# Patient Record
Sex: Female | Born: 1975 | Race: White | Hispanic: No | Smoking: Never smoker
Health system: Southern US, Community
[De-identification: ages and names within clinical notes are randomized; demographics above are authoritative.]

## PROBLEM LIST (undated history)

## (undated) HISTORY — PX: OTHER SURGICAL HISTORY: SHX169

---

## 2008-04-02 ENCOUNTER — Ambulatory Visit: Payer: Self-pay | Admitting: Obstetrics and Gynecology

## 2008-04-08 ENCOUNTER — Ambulatory Visit: Payer: Self-pay | Admitting: Obstetrics and Gynecology

## 2009-03-07 ENCOUNTER — Ambulatory Visit: Payer: Self-pay | Admitting: Obstetrics and Gynecology

## 2014-05-31 ENCOUNTER — Ambulatory Visit: Payer: Self-pay | Admitting: Obstetrics and Gynecology

## 2015-01-30 ENCOUNTER — Encounter: Payer: Self-pay | Admitting: Emergency Medicine

## 2015-01-30 ENCOUNTER — Emergency Department
Admission: EM | Admit: 2015-01-30 | Discharge: 2015-01-31 | Disposition: A | Discharge status: Home / Self Care | Payer: BC Managed Care – PPO | Attending: Student | Admitting: Student

## 2015-01-30 ENCOUNTER — Other Ambulatory Visit: Payer: Self-pay

## 2015-01-30 DIAGNOSIS — T391X2A Poisoning by 4-Aminophenol derivatives, intentional self-harm, initial encounter: Secondary | ICD-10-CM | POA: Diagnosis not present

## 2015-01-30 DIAGNOSIS — R4689 Other symptoms and signs involving appearance and behavior: Secondary | ICD-10-CM

## 2015-01-30 DIAGNOSIS — R45851 Suicidal ideations: Secondary | ICD-10-CM

## 2015-01-30 DIAGNOSIS — T402X2A Poisoning by other opioids, intentional self-harm, initial encounter: Secondary | ICD-10-CM | POA: Diagnosis present

## 2015-01-30 DIAGNOSIS — R4589 Other symptoms and signs involving emotional state: Secondary | ICD-10-CM

## 2015-01-30 DIAGNOSIS — Z3202 Encounter for pregnancy test, result negative: Secondary | ICD-10-CM | POA: Diagnosis not present

## 2015-01-30 DIAGNOSIS — T40601A Poisoning by unspecified narcotics, accidental (unintentional), initial encounter: Secondary | ICD-10-CM

## 2015-01-30 LAB — CBC
HCT: 42.5 % (ref 35.0–47.0)
Hemoglobin: 14 g/dL (ref 12.0–16.0)
MCH: 29.8 pg (ref 26.0–34.0)
MCHC: 33 g/dL (ref 32.0–36.0)
MCV: 90.3 fL (ref 80.0–100.0)
Platelets: 147 10*3/uL — ABNORMAL LOW (ref 150–440)
RBC: 4.7 MIL/uL (ref 3.80–5.20)
RDW: 13.2 % (ref 11.5–14.5)
WBC: 6.7 10*3/uL (ref 3.6–11.0)

## 2015-01-30 LAB — COMPREHENSIVE METABOLIC PANEL
ALK PHOS: 47 U/L (ref 38–126)
ALT: 14 U/L (ref 14–54)
ANION GAP: 10 (ref 5–15)
AST: 23 U/L (ref 15–41)
Albumin: 4.5 g/dL (ref 3.5–5.0)
BILIRUBIN TOTAL: 0.8 mg/dL (ref 0.3–1.2)
BUN: 23 mg/dL — ABNORMAL HIGH (ref 6–20)
CHLORIDE: 102 mmol/L (ref 101–111)
CO2: 26 mmol/L (ref 22–32)
Calcium: 8.7 mg/dL — ABNORMAL LOW (ref 8.9–10.3)
Creatinine, Ser: 0.83 mg/dL (ref 0.44–1.00)
GFR calc Af Amer: >60 mL/min (ref >60)
GLUCOSE: 124 mg/dL — AB (ref 65–99)
POTASSIUM: 3.4 mmol/L — AB (ref 3.5–5.1)
SODIUM: 138 mmol/L (ref 135–145)
Total Protein: 7.6 g/dL (ref 6.5–8.1)

## 2015-01-30 LAB — HEPATIC FUNCTION PANEL
ALBUMIN: 4 g/dL (ref 3.5–5.0)
ALT: 15 U/L (ref 14–54)
AST: 21 U/L (ref 15–41)
Alkaline Phosphatase: 41 U/L (ref 38–126)
BILIRUBIN TOTAL: 0.6 mg/dL (ref 0.3–1.2)
Bilirubin, Direct: <0.1 mg/dL — ABNORMAL LOW (ref 0.1–0.5)
Indirect Bilirubin: UNDETERMINED mg/dL (ref 0.3–0.9)
TOTAL PROTEIN: 7.1 g/dL (ref 6.5–8.1)

## 2015-01-30 LAB — ACETAMINOPHEN LEVEL
Acetaminophen (Tylenol), Serum: 55 ug/mL — ABNORMAL HIGH (ref 10–30)
Acetaminophen (Tylenol), Serum: <10 ug/mL — ABNORMAL LOW (ref 10–30)

## 2015-01-30 LAB — URINE DRUG SCREEN, QUALITATIVE (ARMC ONLY)
Amphetamines, Ur Screen: NOT DETECTED
BARBITURATES, UR SCREEN: NOT DETECTED
Benzodiazepine, Ur Scrn: NOT DETECTED
CANNABINOID 50 NG, UR ~~LOC~~: NOT DETECTED
Cocaine Metabolite,Ur ~~LOC~~: NOT DETECTED
MDMA (ECSTASY) UR SCREEN: NOT DETECTED
METHADONE SCREEN, URINE: NOT DETECTED
Opiate, Ur Screen: POSITIVE — AB
PHENCYCLIDINE (PCP) UR S: NOT DETECTED
Tricyclic, Ur Screen: POSITIVE — AB

## 2015-01-30 LAB — ETHANOL: Alcohol, Ethyl (B): <5 mg/dL (ref <5)

## 2015-01-30 LAB — POCT PREGNANCY, URINE: Preg Test, Ur: NEGATIVE

## 2015-01-30 LAB — SALICYLATE LEVEL

## 2015-01-30 MED ORDER — ACETYLCYSTEINE 20 % IN SOLN
140.0000 mg/kg | Freq: Once | RESPIRATORY_TRACT | Status: AC
  Administered 2015-01-30: 8000 mg via ORAL
  Filled 2015-01-30: qty 40

## 2015-01-30 MED ORDER — ACETYLCYSTEINE 20 % IN SOLN
70.0000 mg/kg | RESPIRATORY_TRACT | Status: DC
  Filled 2015-01-30 (×5): qty 20

## 2015-01-30 NOTE — ED Notes (Signed)
BEHAVIORAL HEALTH ROUNDING Patient sleeping: No. Patient alert and oriented: yes Behavior appropriate: Yes.  ; If no, describe:  Nutrition and fluids offered: Yes  Toileting and hygiene offered: Yes  Sitter present: yes Law enforcement present: Yes  

## 2015-01-30 NOTE — ED Notes (Signed)

## 2015-01-30 NOTE — ED Notes (Signed)
Report given to poison control who is closing their chart.

## 2015-01-30 NOTE — ED Notes (Signed)
Poison control called for an update on patient. States they will call back for an update in 2 hours.

## 2015-01-30 NOTE — ED Notes (Signed)
Patient's brother is visiting. Patient initally was tearful, but is conversing easily with her brother at this time.

## 2015-01-30 NOTE — ED Notes (Signed)
IVC consult ordered

## 2015-01-30 NOTE — ED Provider Notes (Signed)
[facility] Emergency Department Provider Note  ____________________________________________  Time seen: Approximately 11:42 AM  I have reviewed the triage vital signs and the nursing notes.   HISTORY  Chief Complaint Drug Overdose    HPI Kristine Dixon is a 39 y.o. female with no chronic medical problems who presents under IVC for suicidal ideation and intentional overdose. Patient reports that she took "a handful" of pain pills last night around 9 pm. She reports they had codeine in them but does not know what else was in them and the bottles which were confiscated by police are noted to be found. She had some vomiting this morning. She reports that she was trying to kill herself. She has been depressed with suicidal thoughts for the past 2-3 weeks. Symptoms of been constant and worsening. Current severity 10 out of 10. She denies any recent illness including no cough, sneezing, runny nose, congestion. No modifying factors. No homicidal ideation or audiovisual hallucinations.   History reviewed. No pertinent past medical history.  There are no active problems to display for this patient.   History reviewed. No pertinent past surgical history.  No current outpatient prescriptions on file.  Allergies Review of patient's allergies indicates no known allergies.  History reviewed. No pertinent family history.  Social History History  Substance Use Topics  . Smoking status: Never Smoker   . Smokeless tobacco: Not on file  . Alcohol Use: No    Review of Systems Constitutional: No fever/chills Eyes: No visual changes. ENT: No sore throat. Cardiovascular: Denies chest pain. Respiratory: Denies shortness of breath. Gastrointestinal: No abdominal pain.  + nausea, + vomiting.  No diarrhea.  No constipation. Genitourinary: Negative for dysuria. Musculoskeletal: Negative for back pain. Skin: Negative for rash. Neurological: Negative for headaches, focal  weakness or numbness.  10-point ROS otherwise negative.  ____________________________________________   PHYSICAL EXAM:  VITAL SIGNS: ED Triage Vitals  Enc Vitals Group     BP 01/30/15 0948 132/102 mmHg     Pulse Rate 01/30/15 0948 78     Resp --      Temp 01/30/15 0949 97.6 F (36.4 C)     Temp Source 01/30/15 0949 Oral     SpO2 01/30/15 0948 100 %     Weight 01/30/15 0948 126 lb (57.153 kg)     Height 01/30/15 0948 5\' 5"  (1.651 m)     Head Cir --      Peak Flow --      Pain Score --      Pain Loc --      Pain Edu? --      Excl. in GC? --     Constitutional: Alert and oriented. Well appearing and in no acute distress. Eyes: Conjunctivae are normal. PERRL. EOMI. Head: Atraumatic. Nose: No congestion/rhinnorhea. Mouth/Throat: Mucous membranes are moist.  Oropharynx non-erythematous. Neck: No stridor.  Cardiovascular: Normal rate, regular rhythm. Grossly normal heart sounds.  Good peripheral circulation. Respiratory: Normal respiratory effort.  No retractions. Lungs CTAB. Gastrointestinal: Soft and nontender. No distention. No abdominal bruits. No CVA tenderness. Genitourinary: deferred Musculoskeletal: No lower extremity tenderness nor edema.  No joint effusions. Neurologic:  Normal speech and language. No gross focal neurologic deficits are appreciated. Speech is normal. No gait instability. Skin:  Skin is warm, dry and intact. No rash noted. Psychiatric: Mood and affect are normal. Speech and behavior are normal.  ____________________________________________   LABS (all labs ordered are listed, but only abnormal results are displayed)  Labs Reviewed  ACETAMINOPHEN LEVEL - Abnormal; Notable for the following:    Acetaminophen (Tylenol), Serum 55 (*)    All other components within normal limits  CBC - Abnormal; Notable for the following:    Platelets 147 (*)    All other components within normal limits  COMPREHENSIVE METABOLIC PANEL - Abnormal; Notable for the  following:    Potassium 3.4 (*)    Glucose, Bld 124 (*)    BUN 23 (*)    Calcium 8.7 (*)    All other components within normal limits  URINE DRUG SCREEN, QUALITATIVE ([facility]) - Abnormal; Notable for the following:    Tricyclic, Ur Screen POSITIVE (*)    Opiate, Ur Screen POSITIVE (*)    All other components within normal limits  ETHANOL  SALICYLATE LEVEL  POC URINE PREG, ED  POCT PREGNANCY, URINE   ____________________________________________  EKG  ED ECG REPORT   Date: 01/30/2015  EKG Time: 14:16   Rate: 82  Rhythm:normal sinus rhythm  Axis: Normal  Intervals:none  ST&T Change: Nonspecific T-wave inversion in lead III, V2, V3  ____________________________________________  RADIOLOGY  none ____________________________________________   PROCEDURES  Procedure(s) performed: None  Critical Care performed: Yes, see critical care note(s). Total critical care time spent is 45 minutes.  ____________________________________________   INITIAL IMPRESSION / ASSESSMENT AND PLAN / ED COURSE  Pertinent labs & imaging results that were available during my care of the patient were reviewed by me and considered in my medical decision making (see chart for details).  Kristine Dixon is a 39 y.o. female with no chronic medical problems who presents under IVC for suicidal ideation and intentional overdose. Currently vital signs stable, in no acute distress and she has no acute medical complaints. Plan for psychiatric labs, behavioral health consult, continue IVC, psychiatry consult.  ----------------------------------------- 2:24 PM on 01/30/2015 -----------------------------------------  The case was discussed with [facility]: [facility]. Concern for critical Tylenol overdose given acetaminophen level elevated at 55 approximatelty 11 hours after ingestion therefore will follow recommendations to start PO Mucomyst therapy. 140 mg/kg loading dose given, to be followed by every 4 hours  maintenance dose x5 doses after which we will  repeat Tylenol level and LFTs. Patient had normal LFTs on arrival.  ----------------------------------------- 3:15 PM on 01/30/2015 -----------------------------------------  Patient tolerating NAC. Transfer to Dr. Darnelle CatalanMalinda.  ____________________________________________   FINAL CLINICAL IMPRESSION(S) / ED DIAGNOSES  Final diagnoses:  Intentional codeine overdose, initial encounter  Suicidal ideation  Overdose on Tylenol, intentional self-harm, initial encounter      Gayla DossEryka A Delaine Hernandez, MD 01/30/15 1517

## 2015-01-30 NOTE — ED Notes (Signed)
ED BHU PLACEMENT JUSTIFICATION Is the patient under IVC or is there intent for IVC: Yes.   Is the patient medically cleared: Yes.   Is there vacancy in the ED BHU: No. Is the population mix appropriate for patient: Yes.   Is the patient awaiting placement in inpatient or outpatient setting: Yes.   Has the patient had a psychiatric consult: Yes.   Survey of unit performed for contraband, proper placement and condition of furniture, tampering with fixtures in bathroom, shower, and each patient room: Yes.  ; Findings:  APPEARANCE/BEHAVIOR calm, cooperative and adequate rapport can be established NEURO ASSESSMENT Orientation: time, place and person Hallucinations: No.None noted (Hallucinations) Speech: Normal Gait: normal RESPIRATORY ASSESSMENT Normal expansion.  Clear to auscultation.  No rales, rhonchi, or wheezing. CARDIOVASCULAR ASSESSMENT regular rate and rhythm, S1, S2 normal, no murmur, click, rub or gallop GASTROINTESTINAL ASSESSMENT soft, nontender, BS WNL, no r/g EXTREMITIES normal strength, tone, and muscle mass PLAN OF CARE Provide calm/safe environment. Vital signs assessed twice daily. ED BHU Assessment once each 12-hour shift. Collaborate with intake RN daily or as condition indicates. Assure the ED provider has rounded once each shift. Provide and encourage hygiene. Provide redirection as needed. Assess for escalating behavior; address immediately and inform ED provider.  Assess family dynamic and appropriateness for visitation as needed: Yes.  ; If necessary, describe findings:  Educate the patient/family about BHU procedures/visitation: Yes.  ; If necessary, describe findings:

## 2015-01-30 NOTE — ED Notes (Signed)
Brought in  [facility]ia [facility] under IVC for taking a poss overdose

## 2015-01-30 NOTE — ED Notes (Signed)
Patient refused ED behavioral meal. Patient states she eats healthy and won't eat hot dogs and french fries. Patient was given an ED tray with a [place] sandwich.
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
## 2015-01-30 NOTE — ED Notes (Addendum)
BEHAVIORAL HEALTH ROUNDING Patient sleeping: No. Patient alert and oriented: yes Behavior appropriate: Yes.  ; If no, describe:  Nutrition and fluids offered: Yes  Toileting and hygiene offered: Yes  Sitter present: yes Law enforcement present: Yes  

## 2015-01-30 NOTE — ED Notes (Signed)
BEHAVIORAL HEALTH ROUNDING Patient sleeping: Yes.   Patient alert and oriented: not applicable Behavior appropriate: Yes.  ; If no, describe:  Nutrition and fluids offered: No. Pt. Is sleeping. Snack given earlier. Toileting and hygiene offered: No Sitter present: yes Law enforcement present: Yes

## 2015-01-30 NOTE — ED Notes (Signed)
Pt brought in on IVC/No consult ordered at this time

## 2015-01-30 NOTE — Consult Note (Signed)
[facility] Face-to-Face Psychiatry Consult   Reason for Consult:  39 year old woman brought into the hospital after taking an overdose of prescription narcotics. Consult for suicidality Referring Physician:  gayle Patient Identification: LAURIEL HELIN MRN:  [id number] Principal Diagnosis: Recurrent major depression-severe Diagnosis:   Patient Active Problem List   Diagnosis Date Noted  . Recurrent major depression-severe [F33.2] 01/30/2015  . Suicidal behavior [F48.9] 01/30/2015  . Opiate overdose [T40.601A] 01/30/2015    Total Time spent with patient: 1 hour  Subjective:   Kristine Dixon is a 39 y.o. female patient admitted with "I took some pills". Patient admits to taking an overdose of narcotic pain medicine being depressed having suicidal ideation.  HPI:  Patient and chart reviewed. Patient interviewed. Patient says that she took her daughter's pain medicine yesterday. Admits that she took a handful of medicine not prescribed for her. She said she did this because she's been depressed and stressed and was hoping that she would die. She denied feeling depressed for months it's been getting worse for the last couple weeks. Her sleep is very poor at night. Her appetite is poor and she is losing weight. She is not actively getting any psychiatric treatment. Her concentration has been worse. Frequent crying spells. Major stress is that her husband is apparently leaving her for another woman. Overwhelmed. Also describes difficulty taking care of her 43 year old son who still lives at home. Work is also stressful although she still been able to attend it. Denies that she's been drinking denies that she routinely abuses any drugs.  Past psychiatric history: There is a previous episode of severe depression and also revolving around difficulties with her husband about 11 years ago. She thinks she may have had a suicide attempt at that time and was hospitalized briefly. Remember what medicine she took  time.  Social history is that she works at [employer]. She has 2 children ages 61 and 78. Husband is in the process of moving out of the house and leaving her.  Medical history is negative for any significant ongoing medical problems. Patient is not currently on any prescription medicine.  Family history is positive for suicide in one aunt.  Current medications none  Substance abuse history negative for alcohol or drug abuse HPI Elements:   Quality:  Depressed mood and anxiety hopelessness. Severity:  Severe and life-threatening. Timing:  Worse over the past couple months chronic and recurrent. Duration:  Suicidality bad yesterday depression worse for the last few weeks. Context:  Patient is continuing to feel very depressed. Social problems. Marriage breaking up.  Past Medical History: History reviewed. No pertinent past medical history. History reviewed. No pertinent past surgical history. Family History: History reviewed. No pertinent family history. Social History:  History  Alcohol Use No     History  Drug Use Not on file    History   Social History  . Marital Status: Married    Spouse Name: N/A  . Number of Children: N/A  . Years of Education: N/A   Social History Main Topics  . Smoking status: Never Smoker   . Smokeless tobacco: Not on file  . Alcohol Use: No  . Drug Use: Not on file  . Sexual Activity: Not on file   Other Topics Concern  . None   Social History Narrative  . None   Additional Social History:  Allergies:  Allergies no known allergies  Labs:  Results for orders placed or performed during the hospital encounter of 01/30/15 (from the past 48 hour(s))  Urine Drug Screen, [facility])     Status: Abnormal   Collection Time: 01/30/15  9:54 AM  Result Value Ref Range   Tricyclic, Ur Screen POSITIVE (A) NONE DETECTED   Amphetamines, Ur Screen NONE DETECTED NONE DETECTED   MDMA (Ecstasy)Ur Screen NONE  DETECTED NONE DETECTED   Cocaine Metabolite,Ur [place] NONE DETECTED NONE DETECTED   Opiate, Ur Screen POSITIVE (A) NONE DETECTED   Phencyclidine (PCP) Ur S NONE DETECTED NONE DETECTED   Cannabinoid 50 Ng, Ur [place] NONE DETECTED NONE DETECTED   Barbiturates, Ur Screen NONE DETECTED NONE DETECTED   Benzodiazepine, Ur Scrn NONE DETECTED NONE DETECTED   Methadone Scn, Ur NONE DETECTED NONE DETECTED    Comment: (NOTE) 510  Tricyclics, urine               Cutoff 1000 ng/mL 200  Amphetamines, urine             Cutoff 1000 ng/mL 300  MDMA (Ecstasy), urine           Cutoff 500 ng/mL 400  Cocaine Metabolite, urine       Cutoff 300 ng/mL 500  Opiate, urine                   Cutoff 300 ng/mL 600  Phencyclidine (PCP), urine      Cutoff 25 ng/mL 700  Cannabinoid, urine              Cutoff 50 ng/mL 800  Barbiturates, urine             Cutoff 200 ng/mL 900  Benzodiazepine, urine           Cutoff 200 ng/mL 1000 Methadone, urine                Cutoff 300 ng/mL 1100 1200 The urine drug screen provides only a preliminary, unconfirmed 1300 analytical test result and should not be used for non-medical 1400 purposes. Clinical consideration and professional judgment should 1500 be applied to any positive drug screen result due to possible 1600 interfering substances. A more specific alternate chemical method 1700 must be used in order to obtain a confirmed analytical result.  1800 Gas chromato graphy / mass spectrometry (GC/MS) is the preferred 1900 confirmatory method.   Acetaminophen level     Status: Abnormal   Collection Time: 01/30/15  9:56 AM  Result Value Ref Range   Acetaminophen (Tylenol), Serum 55 (H) 10 - 30 ug/mL    Comment:        THERAPEUTIC CONCENTRATIONS VARY SIGNIFICANTLY. A RANGE OF 10-30 ug/mL MAY BE AN EFFECTIVE CONCENTRATION FOR MANY PATIENTS. HOWEVER, SOME ARE BEST TREATED AT CONCENTRATIONS OUTSIDE THIS RANGE. ACETAMINOPHEN CONCENTRATIONS >150 ug/mL AT 4 HOURS AFTER INGESTION AND  >50 ug/mL AT 12 HOURS AFTER INGESTION ARE OFTEN ASSOCIATED WITH TOXIC REACTIONS.   CBC     Status: Abnormal   Collection Time: 01/30/15  9:56 AM  Result Value Ref Range   WBC 6.7 3.6 - 11.0 K/uL   RBC 4.70 3.80 - 5.20 MIL/uL   Hemoglobin 14.0 12.0 - 16.0 g/dL   HCT 42.5 35.0 - 47.0 %   MCV 90.3 80.0 - 100.0 fL   MCH 29.8 26.0 - 34.0 pg   MCHC 33.0 32.0 - 36.0 g/dL   RDW 13.2 11.5 - 14.5 %   Platelets  147 (L) 150 - 440 K/uL  Comprehensive metabolic panel     Status: Abnormal   Collection Time: 01/30/15  9:56 AM  Result Value Ref Range   Sodium 138 135 - 145 mmol/L   Potassium 3.4 (L) 3.5 - 5.1 mmol/L   Chloride 102 101 - 111 mmol/L   CO2 26 22 - 32 mmol/L   Glucose, Bld 124 (H) 65 - 99 mg/dL   BUN 23 (H) 6 - 20 mg/dL   Creatinine, Ser 0.83 0.44 - 1.00 mg/dL   Calcium 8.7 (L) 8.9 - 10.3 mg/dL   Total Protein 7.6 6.5 - 8.1 g/dL   Albumin 4.5 3.5 - 5.0 g/dL   AST 23 15 - 41 U/L   ALT 14 14 - 54 U/L   Alkaline Phosphatase 47 38 - 126 U/L   Total Bilirubin 0.8 0.3 - 1.2 mg/dL   GFR calc non Af Amer >60 >60 mL/min   GFR calc Af Amer >60 >60 mL/min    Comment: (NOTE) The eGFR has been calculated using the CKD EPI equation. This calculation has not been validated in all clinical situations. eGFR's persistently <60 mL/min signify possible Chronic Kidney Disease.    Anion gap 10 5 - 15  Ethanol (ETOH)     Status: None   Collection Time: 01/30/15  9:56 AM  Result Value Ref Range   Alcohol, Ethyl (B) <5 <5 mg/dL    Comment:        LOWEST DETECTABLE LIMIT FOR SERUM ALCOHOL IS 11 mg/dL FOR MEDICAL PURPOSES ONLY   Salicylate level     Status: None   Collection Time: 01/30/15  9:56 AM  Result Value Ref Range   Salicylate Lvl <1.6 2.8 - 30.0 mg/dL  Pregnancy, urine POC     Status: None   Collection Time: 01/30/15 10:05 AM  Result Value Ref Range   Preg Test, Ur NEGATIVE NEGATIVE    Comment:        THE SENSITIVITY OF THIS METHODOLOGY IS >24 mIU/mL     Vitals: Blood  pressure 132/102, pulse 78, temperature 97.6 F (36.4 C), temperature source Oral, height _0  (1.651 m), weight 57.153 kg (126 lb), SpO2 100 %.  Risk to Self: Is patient at risk for suicide?: Yes Risk to Others:   Prior Inpatient Therapy:   Prior Outpatient Therapy:    Current Facility-Administered Medications  Medication Dose Route Frequency Provider Last Rate Last Dose  . acetylcysteine (MUCOMYST) 20 % nebulizer / oral solution 4,000 mg  70 mg/kg Oral Q4H Joanne Gavel, MD       No current outpatient prescriptions on file.    Musculoskeletal: Strength & Muscle Tone: within normal limits Gait & Station: normal Patient leans: N/A  Psychiatric Specialty Exam: Physical Exam  Constitutional: She is oriented to person, place, and time. She appears well-developed and well-nourished.  HENT:  Head: Normocephalic.  Eyes: Pupils are equal, round, and reactive to light.  Neck: Normal range of motion. Neck supple.  Respiratory: Effort normal.  Musculoskeletal: Normal range of motion.  Neurological: She is alert and oriented to person, place, and time.  Skin: Skin is warm and dry.  Psychiatric: Her mood appears anxious. Her affect is blunt. Her speech is delayed. She is withdrawn. She expresses impulsivity. She exhibits a depressed mood. She expresses suicidal ideation. She exhibits abnormal recent memory.    Review of Systems  Constitutional: Positive for malaise/fatigue.  HENT: Negative.   Eyes: Negative.   Respiratory: Negative.  Cardiovascular: Negative.   Gastrointestinal: Negative.   Skin: Negative.   Neurological: Negative.   Psychiatric/Behavioral: Positive for depression, suicidal ideas and substance abuse. Negative for hallucinations and memory loss. The patient is nervous/anxious and has insomnia.     Blood pressure 132/102, pulse 78, temperature 97.6 F (36.4 C), temperature source Oral, height _0  (1.651 m), weight 57.153 kg (126 lb), SpO2 100 %.Body mass index is  20.97 kg/(m^2).  General Appearance: Negative and Disheveled  Eye Contact::  Minimal  Speech:  Slow  Volume:  Decreased  Mood:  Depressed  Affect:  Flat  Thought Process:  Circumstantial and Tangential  Orientation:  Full (Time, Place, and Person)  Thought Content:  Negative  Suicidal Thoughts:  Yes.  with intent/plan  Homicidal Thoughts:  No  Memory:  Immediate;   Fair Recent;   Fair Remote;   Fair  Judgement:  Impaired  Insight:  Shallow  Psychomotor Activity:  Decreased  Concentration:  Fair  Recall:  [employer] of Knowledge:Fair  Language: Good  Akathisia:  No  Handed:  Right  AIMS (if indicated):     Assets:  Communication Skills Housing Talents/Skills  ADL's:  Intact  Cognition: WNL  Sleep:      Medical Decision Making: Review of Psycho-Social Stressors (1), Established Problem, Worsening (2), New Problem, with no additional work-up planned (3), Review of Last Therapy Session (1), Review or order medicine tests (1) and Review of Medication Regimen & Side Effects (2)  Treatment Plan Summary: Medication management and Plan Patient had a serious suicide attempt multiple symptoms of major depression. Requires inpatient hospitalization. Patient will be admitted to the psychiatric service. Primary treatment team can consider possible medication. Sleep and anxiety medicine when necessary for the time being. Continue involuntary commitment. Case discussed with emergency room physician and psychiatrist staff  Plan:  Recommend psychiatric Inpatient admission when medically cleared. Disposition: Recommend admission to psychiatry  Alethia Berthold 01/30/2015 5:06 PM

## 2015-01-30 NOTE — ED Notes (Signed)
Patient has been given something to drink throughout the day, but states she hasn't had anything to drink today. Patient is expressing some agitation with the rules regarding visitors and showers.

## 2015-01-31 ENCOUNTER — Inpatient Hospital Stay
Admission: EM | Admit: 2015-01-31 | Discharge: 2015-01-31 | DRG: 882 | Disposition: A | Discharge status: Home / Self Care | Payer: BC Managed Care – PPO | Source: Ambulatory Visit | Attending: Psychiatry | Admitting: Psychiatry

## 2015-01-31 DIAGNOSIS — F4323 Adjustment disorder with mixed anxiety and depressed mood: Secondary | ICD-10-CM | POA: Diagnosis present

## 2015-01-31 DIAGNOSIS — Z630 Problems in relationship with spouse or partner: Secondary | ICD-10-CM

## 2015-01-31 DIAGNOSIS — T40601A Poisoning by unspecified narcotics, accidental (unintentional), initial encounter: Secondary | ICD-10-CM | POA: Diagnosis present

## 2015-01-31 NOTE — Progress Notes (Signed)
Patient ID: Elesa HackerJane E Smithey, female   DOB: 06/20/1976, 39 y.o.   MRN: [id number]  Admission Note:  [age] yr female who presents IVC in no acute distress for the treatment of SI and Depression. Pt appears flat and depressed. Pt was calm and cooperative with admission process. Pt denies SI/HI/AVH at this time. Pt stated she was having problems with her marriage. Pt husband has been talking with an ex- girlfriend on his new [employer] account. Pt new to facebook 3 months. Pt focused on her situation and appears to be in denial about the things that have happened. Pt stated she has to get back to work Monday. On assessment pt admitted to taking handful of Tylenol, and realizing it was a mistake immediately after taking them. Pt appears to be focused on her husband  And the situation rather than getting herself together.    A:POC and unit policies explained and understanding verbalized. Consents obtained. Food and fluids offered, and refused .   R:Pt had no additional questions or concerns.

## 2015-01-31 NOTE — [facility] Suicide Risk Assessment (Signed)
[facility]   Demographic Factors:  NA  Total Time spent with patient: 1 hour  Musculoskeletal: Strength & Muscle Tone: within normal limits Gait & Station: normal Patient leans: N/A  Psychiatric Specialty Exam: Physical Exam  Nursing note and vitals reviewed.   Review of Systems  All other systems reviewed and are negative.   Blood pressure 107/66, pulse 97, temperature 98.6 F (37 C), temperature source Oral, resp. rate 18, height 5\' 5"  (1.651 m), weight 56.246 kg (124 lb), last menstrual period 01/01/2015.Body mass index is 20.63 kg/(m^2).  See SRA                                              Sleep:  Number of Hours: 6.15  Cognition: WNL  ADL's:  Intact   Have you used any form of tobacco in the last 30 days? (Cigarettes, Smokeless Tobacco, Cigars, and/or Pipes): No  Has this patient used any form of tobacco in the last 30 days? (Cigarettes, Smokeless Tobacco, Cigars, and/or Pipes) No  Mental Status Per Nursing Assessment::   On Admission:     Current Mental Status by Physician: NA  Loss Factors: Loss of significant relationship and NA  Historical Factors: NA  Risk Reduction Factors:   Responsible for children under 41 years of age, Sense of responsibility to family, Religious beliefs about death, Employed, Living with another person, especially a relative, Positive social support and Positive coping skills or problem solving skills  Continued Clinical Symptoms: None.   Cognitive Features That Contribute To Risk:  None    Suicide Risk:  Minimal: No identifiable suicidal ideation.  Patients presenting with no risk factors but with morbid ruminations; may be classified as minimal risk based on the severity of the depressive symptoms  Principal Problem: Adjustment disorder with mixed anxiety and depressed mood Discharge Diagnoses:  Patient Active Problem List   Diagnosis Date Noted  . Adjustment disorder with  mixed anxiety and depressed mood [F43.23] 01/31/2015  . Opiate overdose [T40.601A] 01/30/2015    Follow-up Information    Schedule an appointment as soon as possible for a visit with Therapist.   Why:  Hospital Follow up, Therapy   Contact information:   Please follow up with one of the providers on the list given to you.  If there is any trouble please call [phone] for assistance with an appointment.      Plan Of Care/Follow-up recommendations:  Activity:  as tolerated Diet:  regular Other:  follow up with therapy appointemnty.  Is patient on multiple antipsychotic therapies at discharge:  No   Has Patient had three or more failed trials of antipsychotic monotherapy by history:  No  Recommended Plan for Multiple Antipsychotic Therapies: NA    Kristine Dixon 01/31/2015, 1:34 PM

## 2015-01-31 NOTE — H&P (Signed)
Psychiatric Admission Assessment Adult  Patient Identification: Kristine Dixon MRN:  [id number] Date of Evaluation:  01/31/2015 Chief Complaint:  behav med Principal Diagnosis: Adjustment disorder with mixed anxiety and depressed mood Diagnosis:   Patient Active Problem List   Diagnosis Date Noted  . Adjustment disorder with mixed anxiety and depressed mood [F43.23] 01/31/2015  . Opiate overdose [T40.601A] 01/30/2015   History of Present Illness::   Identifying data. Kristine Dixon is a 39 year old female with no past psychiatric history.  Chief complaint. "I'm mad."  History of present illness. Kristine Dixon has no past psychiatric history. She was brought to the emergency room after she took some pain killers that were prescribed for her daughter. This is in the context of marital conflict and possibly divorce. She has been married for 30 some years. Recently her husband disclosed that he is having a long distance Facebook affair with his old flame. He wants a divorce. He has already contacted deciliter. This was unexpected even though the marriage has not been happy lately. The patient took PAINKILLERS that she found in her daughter's room to go to sleep but denies having suicidal intention. She went to sleep and woke up the next morning to realize that her daughter was aware of her suicide attempt. She noticed an empty bottle. She is upset because she thinks that her husband about the overdose and he did not respond. She ended up in the hospital after her daughter contacted her mother-in-law the morning following an overdose. As she denies any symptoms of depression at present. She denies excessive anxiety. She denies symptoms suggestive of bipolar mania. There are no psychotic symptoms. She is not suicidal or homicidal. She denies any use of alcohol prescription pills or illicit substances.  Past psychiatric history. She has never been treated for depression, never hospitalized, no suicide  attempts.  Total Time spent with patient: 1 hour  Past Medical History: History reviewed. No pertinent past medical history.  Past Surgical History  Procedure Laterality Date  . Other surgical history      right ovary removed 2010   Family History: History reviewed. No pertinent family history. Social History:  History  Alcohol Use No     History  Drug Use Not on file    History   Social History  . Marital Status: Married    Spouse Name: N/A  . Number of Children: N/A  . Years of Education: N/A   Social History Main Topics  . Smoking status: Never Smoker   . Smokeless tobacco: Not on file  . Alcohol Use: No  . Drug Use: Not on file  . Sexual Activity: Yes    Birth Control/ Protection: None   Other Topics Concern  . None   Social History Narrative   Additional Social History:  She has known her husband since the age of 79. They've been married for a long time. They have 2 children while in college and one high school senior. 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                          Musculoskeletal: Strength & Muscle Tone: within normal limits Gait & Station: normal Patient leans: N/A  Psychiatric Specialty Exam: Physical Exam  Nursing note and vitals reviewed. Eyes: No scleral icterus.    Review of Systems  All other systems reviewed and are negative.   Blood pressure 107/66, pulse 97,  temperature 98.6 F (37 C), temperature source Oral, resp. rate 18, height '5\' 5"'  (1.651 m), weight 56.246 kg (124 lb), last menstrual period 01/01/2015.Body mass index is 20.63 kg/(m^2).  See SRA                                                  Sleep:  Number of Hours: 6.15   Risk to Self: Is patient at risk for suicide?: No Risk to Others:   Prior Inpatient Therapy:   Prior Outpatient Therapy:    Alcohol Screening: 1. How often do you have a drink containing alcohol?: Never 9. Have you  or someone else been injured as a result of your drinking?: No 10. Has a relative or friend or a doctor or another health worker been concerned about your drinking or suggested you cut down?: No Alcohol Use Disorder Identification Test Final Score (AUDIT): 0 Brief Intervention: AUDIT score less than 7 or less-screening does not suggest unhealthy drinking-brief intervention not indicated  Allergies:  No Known Allergies Lab Results:  Results for orders placed or performed during the hospital encounter of 01/30/15 (from the past 48 hour(s))  Urine Drug Screen, Qualitative [facility])     Status: Abnormal   Collection Time: 01/30/15  9:54 AM  Result Value Ref Range   Tricyclic, Ur Screen POSITIVE (A) NONE DETECTED   Amphetamines, Ur Screen NONE DETECTED NONE DETECTED   MDMA (Ecstasy)Ur Screen NONE DETECTED NONE DETECTED   Cocaine Metabolite,Ur [place] NONE DETECTED NONE DETECTED   Opiate, Ur Screen POSITIVE (A) NONE DETECTED   Phencyclidine (PCP) Ur S NONE DETECTED NONE DETECTED   Cannabinoid 50 Ng, Ur [place] NONE DETECTED NONE DETECTED   Barbiturates, Ur Screen NONE DETECTED NONE DETECTED   Benzodiazepine, Ur Scrn NONE DETECTED NONE DETECTED   Methadone Scn, Ur NONE DETECTED NONE DETECTED    Comment: (NOTE) 829  Tricyclics, urine               Cutoff 1000 ng/mL 200  Amphetamines, urine             Cutoff 1000 ng/mL 300  MDMA (Ecstasy), urine           Cutoff 500 ng/mL 400  Cocaine Metabolite, urine       Cutoff 300 ng/mL 500  Opiate, urine                   Cutoff 300 ng/mL 600  Phencyclidine (PCP), urine      Cutoff 25 ng/mL 700  Cannabinoid, urine              Cutoff 50 ng/mL 800  Barbiturates, urine             Cutoff 200 ng/mL 900  Benzodiazepine, urine           Cutoff 200 ng/mL 1000 Methadone, urine                Cutoff 300 ng/mL 1100 1200 The urine drug screen provides only a preliminary, unconfirmed 1300 analytical test result and should not be used for non-medical 1400 purposes. Clinical  consideration and professional judgment should 1500 be applied to any positive drug screen result due to possible 1600 interfering substances. A more specific alternate chemical method 1700 must be used in order to obtain a confirmed analytical result.  [place] /  mass spectrometry (GC/MS) is the preferred 1900 confirmatory method.   Acetaminophen level     Status: Abnormal   Collection Time: 01/30/15  9:56 AM  Result Value Ref Range   Acetaminophen (Tylenol), Serum 55 (H) 10 - 30 ug/mL    Comment:        THERAPEUTIC CONCENTRATIONS VARY SIGNIFICANTLY. A RANGE OF 10-30 ug/mL MAY BE AN EFFECTIVE CONCENTRATION FOR MANY PATIENTS. HOWEVER, SOME ARE BEST TREATED AT CONCENTRATIONS OUTSIDE THIS RANGE. ACETAMINOPHEN CONCENTRATIONS >150 ug/mL AT 4 HOURS AFTER INGESTION AND >50 ug/mL AT 12 HOURS AFTER INGESTION ARE OFTEN ASSOCIATED WITH TOXIC REACTIONS.   CBC     Status: Abnormal   Collection Time: 01/30/15  9:56 AM  Result Value Ref Range   WBC 6.7 3.6 - 11.0 K/uL   RBC 4.70 3.80 - 5.20 MIL/uL   Hemoglobin 14.0 12.0 - 16.0 g/dL   HCT 42.5 35.0 - 47.0 %   MCV 90.3 80.0 - 100.0 fL   MCH 29.8 26.0 - 34.0 pg   MCHC 33.0 32.0 - 36.0 g/dL   RDW 13.2 11.5 - 14.5 %   Platelets 147 (L) 150 - 440 K/uL  Comprehensive metabolic panel     Status: Abnormal   Collection Time: 01/30/15  9:56 AM  Result Value Ref Range   Sodium 138 135 - 145 mmol/L   Potassium 3.4 (L) 3.5 - 5.1 mmol/L   Chloride 102 101 - 111 mmol/L   CO2 26 22 - 32 mmol/L   Glucose, Bld 124 (H) 65 - 99 mg/dL   BUN 23 (H) 6 - 20 mg/dL   Creatinine, Ser 0.83 0.44 - 1.00 mg/dL   Calcium 8.7 (L) 8.9 - 10.3 mg/dL   Total Protein 7.6 6.5 - 8.1 g/dL   Albumin 4.5 3.5 - 5.0 g/dL   AST 23 15 - 41 U/L   ALT 14 14 - 54 U/L   Alkaline Phosphatase 47 38 - 126 U/L   Total Bilirubin 0.8 0.3 - 1.2 mg/dL   GFR calc non Af Amer >60 >60 mL/min   GFR calc Af Amer >60 >60 mL/min    Comment: (NOTE) The eGFR has been  calculated using the CKD EPI equation. This calculation has not been validated in all clinical situations. eGFR's persistently <60 mL/min signify possible Chronic Kidney Disease.    Anion gap 10 5 - 15  Ethanol (ETOH)     Status: None   Collection Time: 01/30/15  9:56 AM  Result Value Ref Range   Alcohol, Ethyl (B) <5 <5 mg/dL    Comment:        LOWEST DETECTABLE LIMIT FOR SERUM ALCOHOL IS 11 mg/dL FOR MEDICAL PURPOSES ONLY   Salicylate level     Status: None   Collection Time: 01/30/15  9:56 AM  Result Value Ref Range   Salicylate Lvl <3.2 2.8 - 30.0 mg/dL  Pregnancy, urine POC     Status: None   Collection Time: 01/30/15 10:05 AM  Result Value Ref Range   Preg Test, Ur NEGATIVE NEGATIVE    Comment:        THE SENSITIVITY OF THIS METHODOLOGY IS >24 mIU/mL   Hepatic function panel     Status: Abnormal   Collection Time: 01/30/15  5:52 PM  Result Value Ref Range   Total Protein 7.1 6.5 - 8.1 g/dL   Albumin 4.0 3.5 - 5.0 g/dL   AST 21 15 - 41 U/L   ALT 15 14 - 54 U/L   Alkaline  Phosphatase 41 38 - 126 U/L   Total Bilirubin 0.6 0.3 - 1.2 mg/dL   Bilirubin, Direct <0.1 (L) 0.1 - 0.5 mg/dL   Indirect Bilirubin UNABLE TO CALCULATE 0.3 - 0.9 mg/dL  Acetaminophen level     Status: Abnormal   Collection Time: 01/30/15  6:41 PM  Result Value Ref Range   Acetaminophen (Tylenol), Serum <10 (L) 10 - 30 ug/mL    Comment:        THERAPEUTIC CONCENTRATIONS VARY SIGNIFICANTLY. A RANGE OF 10-30 ug/mL MAY BE AN EFFECTIVE CONCENTRATION FOR MANY PATIENTS. HOWEVER, SOME ARE BEST TREATED AT CONCENTRATIONS OUTSIDE THIS RANGE. ACETAMINOPHEN CONCENTRATIONS >150 ug/mL AT 4 HOURS AFTER INGESTION AND >50 ug/mL AT 12 HOURS AFTER INGESTION ARE OFTEN ASSOCIATED WITH TOXIC REACTIONS.    Current Medications: No current facility-administered medications for this encounter.   PTA Medications: No prescriptions prior to admission    Previous Psychotropic Medications: No   Substance Abuse  History in the last 12 months:  No.    Consequences of Substance Abuse: NA  Results for orders placed or performed during the hospital encounter of 01/30/15 (from the past 72 hour(s))  Urine Drug Screen, Qualitative [facility])     Status: Abnormal   Collection Time: 01/30/15  9:54 AM  Result Value Ref Range   Tricyclic, Ur Screen POSITIVE (A) NONE DETECTED   Amphetamines, Ur Screen NONE DETECTED NONE DETECTED   MDMA (Ecstasy)Ur Screen NONE DETECTED NONE DETECTED   Cocaine Metabolite,Ur [place] NONE DETECTED NONE DETECTED   Opiate, Ur Screen POSITIVE (A) NONE DETECTED   Phencyclidine (PCP) Ur S NONE DETECTED NONE DETECTED   Cannabinoid 50 Ng, Ur [place] NONE DETECTED NONE DETECTED   Barbiturates, Ur Screen NONE DETECTED NONE DETECTED   Benzodiazepine, Ur Scrn NONE DETECTED NONE DETECTED   Methadone Scn, Ur NONE DETECTED NONE DETECTED    Comment: (NOTE) 301  Tricyclics, urine               Cutoff 1000 ng/mL 200  Amphetamines, urine             Cutoff 1000 ng/mL 300  MDMA (Ecstasy), urine           Cutoff 500 ng/mL 400  Cocaine Metabolite, urine       Cutoff 300 ng/mL 500  Opiate, urine                   Cutoff 300 ng/mL 600  Phencyclidine (PCP), urine      Cutoff 25 ng/mL 700  Cannabinoid, urine              Cutoff 50 ng/mL 800  Barbiturates, urine             Cutoff 200 ng/mL 900  Benzodiazepine, urine           Cutoff 200 ng/mL 1000 Methadone, urine                Cutoff 300 ng/mL 1100 1200 The urine drug screen provides only a preliminary, unconfirmed 1300 analytical test result and should not be used for non-medical 1400 purposes. Clinical consideration and professional judgment should 1500 be applied to any positive drug screen result due to possible 1600 interfering substances. A more specific alternate chemical method 1700 must be used in order to obtain a confirmed analytical result.  1800 Gas chromato graphy / mass spectrometry (GC/MS) is the preferred 1900 confirmatory method.    Acetaminophen level     Status: Abnormal   Collection Time:  01/30/15  9:56 AM  Result Value Ref Range   Acetaminophen (Tylenol), Serum 55 (H) 10 - 30 ug/mL    Comment:        THERAPEUTIC CONCENTRATIONS VARY SIGNIFICANTLY. A RANGE OF 10-30 ug/mL MAY BE AN EFFECTIVE CONCENTRATION FOR MANY PATIENTS. HOWEVER, SOME ARE BEST TREATED AT CONCENTRATIONS OUTSIDE THIS RANGE. ACETAMINOPHEN CONCENTRATIONS >150 ug/mL AT 4 HOURS AFTER INGESTION AND >50 ug/mL AT 12 HOURS AFTER INGESTION ARE OFTEN ASSOCIATED WITH TOXIC REACTIONS.   CBC     Status: Abnormal   Collection Time: 01/30/15  9:56 AM  Result Value Ref Range   WBC 6.7 3.6 - 11.0 K/uL   RBC 4.70 3.80 - 5.20 MIL/uL   Hemoglobin 14.0 12.0 - 16.0 g/dL   HCT 42.5 35.0 - 47.0 %   MCV 90.3 80.0 - 100.0 fL   MCH 29.8 26.0 - 34.0 pg   MCHC 33.0 32.0 - 36.0 g/dL   RDW 13.2 11.5 - 14.5 %   Platelets 147 (L) 150 - 440 K/uL  Comprehensive metabolic panel     Status: Abnormal   Collection Time: 01/30/15  9:56 AM  Result Value Ref Range   Sodium 138 135 - 145 mmol/L   Potassium 3.4 (L) 3.5 - 5.1 mmol/L   Chloride 102 101 - 111 mmol/L   CO2 26 22 - 32 mmol/L   Glucose, Bld 124 (H) 65 - 99 mg/dL   BUN 23 (H) 6 - 20 mg/dL   Creatinine, Ser 0.83 0.44 - 1.00 mg/dL   Calcium 8.7 (L) 8.9 - 10.3 mg/dL   Total Protein 7.6 6.5 - 8.1 g/dL   Albumin 4.5 3.5 - 5.0 g/dL   AST 23 15 - 41 U/L   ALT 14 14 - 54 U/L   Alkaline Phosphatase 47 38 - 126 U/L   Total Bilirubin 0.8 0.3 - 1.2 mg/dL   GFR calc non Af Amer >60 >60 mL/min   GFR calc Af Amer >60 >60 mL/min    Comment: (NOTE) The eGFR has been calculated using the CKD EPI equation. This calculation has not been validated in all clinical situations. eGFR's persistently <60 mL/min signify possible Chronic Kidney Disease.    Anion gap 10 5 - 15  Ethanol (ETOH)     Status: None   Collection Time: 01/30/15  9:56 AM  Result Value Ref Range   Alcohol, Ethyl (B) <5 <5 mg/dL    Comment:         LOWEST DETECTABLE LIMIT FOR SERUM ALCOHOL IS 11 mg/dL FOR MEDICAL PURPOSES ONLY   Salicylate level     Status: None   Collection Time: 01/30/15  9:56 AM  Result Value Ref Range   Salicylate Lvl <1.7 2.8 - 30.0 mg/dL  Pregnancy, urine POC     Status: None   Collection Time: 01/30/15 10:05 AM  Result Value Ref Range   Preg Test, Ur NEGATIVE NEGATIVE    Comment:        THE SENSITIVITY OF THIS METHODOLOGY IS >24 mIU/mL   Hepatic function panel     Status: Abnormal   Collection Time: 01/30/15  5:52 PM  Result Value Ref Range   Total Protein 7.1 6.5 - 8.1 g/dL   Albumin 4.0 3.5 - 5.0 g/dL   AST 21 15 - 41 U/L   ALT 15 14 - 54 U/L   Alkaline Phosphatase 41 38 - 126 U/L   Total Bilirubin 0.6 0.3 - 1.2 mg/dL   Bilirubin, Direct <0.1 (L) 0.1 -  0.5 mg/dL   Indirect Bilirubin UNABLE TO CALCULATE 0.3 - 0.9 mg/dL  Acetaminophen level     Status: Abnormal   Collection Time: 01/30/15  6:41 PM  Result Value Ref Range   Acetaminophen (Tylenol), Serum <10 (L) 10 - 30 ug/mL    Comment:        THERAPEUTIC CONCENTRATIONS VARY SIGNIFICANTLY. A RANGE OF 10-30 ug/mL MAY BE AN EFFECTIVE CONCENTRATION FOR MANY PATIENTS. HOWEVER, SOME ARE BEST TREATED AT CONCENTRATIONS OUTSIDE THIS RANGE. ACETAMINOPHEN CONCENTRATIONS >150 ug/mL AT 4 HOURS AFTER INGESTION AND >50 ug/mL AT 12 HOURS AFTER INGESTION ARE OFTEN ASSOCIATED WITH TOXIC REACTIONS.     Observation Level/Precautions:  15 minute checks  Laboratory:  CBC Chemistry Profile UDS UA  Psychotherapy:    Medications:    Consultations:    Discharge Concerns:    Estimated LOS:  Other:     Psychological Evaluations: No   Treatment Plan Summary: Hospitalization.   Medical Decision Making:  Review of Psycho-Social Stressors (1), Review or order clinical lab tests (1), New Problem, with no additional work-up planned (3) and Review of Medication Regimen & Side Effects (2)   Mrs. Stagner is a 39 year old female with no past psychiatric  history admitted after overdose on pain medication in the context of marital conflict.  1. Suicidal ideation. At the patient denies any thoughts of hurting herself or others. She denies any intention or plan. She is able to contract for safety.  2. Mood and anxiety. At the patient reports slight anxiety. She denies any symptoms of depression. There is no history of depression. She is mad at her husband who wants a divorce. She has 2 children at job she loves and supportive family. She is not interested in taking psychotropic medication. She is open to psychotherapy.  3. Disposition. The patient will be discharged to home. She will follow up with her therapist within her network. She was given information about our local therapist.   I certify that inpatient services furnished can reasonably be expected to improve the patient's condition.   Orson Slick 5/13/201612:30 PM

## 2015-01-31 NOTE — Tx Team (Signed)
Initial Interdisciplinary Treatment Plan   PATIENT STRESSORS: Financial difficulties Marital or family conflict Occupational concerns   PATIENT STRENGTHS: Ability for insight Active sense of humor Average or above average intelligence Capable of independent living General fund of knowledge Supportive family/friends   PROBLEM LIST: Problem List/Patient Goals Date to be addressed Date deferred Reason deferred Estimated date of resolution  Risk for suicide 01/31/15     Over dose 01/31/15      marriage problems 01/31/15                                          DISCHARGE CRITERIA:  Ability to meet basic life and health needs Adequate post-discharge living arrangements Improved stabilization in mood, thinking, and/or behavior  PRELIMINARY DISCHARGE PLAN: Attend aftercare/continuing care group Outpatient therapy  PATIENT/FAMIILY INVOLVEMENT: This treatment plan has been presented to and reviewed with the patient, [name].  The patient and family have been given the opportunity to ask questions and make suggestions.  Jacques Navyhillips, Nikkole Placzek A 01/31/2015, 2:07 AM

## 2015-01-31 NOTE — [facility] Group Notes (Signed)
[facility] Group Notes:  (Nursing/MHT/Case Management/Adjunct)  Date:  01/31/2015  Time:  1:39 PM  Type of Therapy:  Psychoeducational Skills  Participation Level:  Active  Participation Quality:  Appropriate and Attentive  Affect:  Appropriate  Cognitive:  Appropriate  Insight:  Appropriate  Engagement in Group:  Engaged  Modes of Intervention:  Discussion and Education  Summary of Progress/Problems:  Kristine Dixon 01/31/2015, 1:39 PM

## 2015-01-31 NOTE — Progress Notes (Signed)
  [facility]  Will you be returning to the same living situation after discharge:  Yes,  home At discharge, do you have transportation home?: Yes,     Do you have the ability to pay for your medications: Yes,  BC[employer] insurance   Patient to Follow up at: Follow-up Information    Schedule an appointment as soon as possible for a visit with Therapist.   Why:  Hospital Follow up, Therapy   Contact information:   Please follow up with one of the providers on the list given to you.  If there is any trouble please call [phone] for assistance with an appointment.      Patient denies SI/HI: Yes,       Safety Planning and Suicide Prevention discussed: Yes,     Have you used any form of tobacco in the last 30 days? (Cigarettes, Smokeless Tobacco, Cigars, and/or Pipes): No  Has patient been referred to the Quitline?: N/A patient is not a smoker  Kristine Dixon, Boden Stucky P 01/31/2015, 1:52 PM

## 2015-01-31 NOTE — Progress Notes (Signed)
Pt discharged home. DC instructions provided and explained. Medications reviewed. Rx given. All questions answered. Pt stable at discharge. Denies SI, HI, AVH. 

## 2015-01-31 NOTE — [facility] Suicide Risk Assessment (Signed)
[facility] INPATIENT:  Family/Significant Other Suicide Prevention Education  Suicide Prevention Education:  Patient Refusal for Family/Significant Other Suicide Prevention Education: The patient Kristine Dixon has refused to provide written consent for family/significant other to be provided Family/Significant Other Suicide Prevention Education during admission and/or prior to discharge.  Physician notified.  Kristine Dixon, Kristine Dixon 01/31/2015, 1:52 PM

## 2015-01-31 NOTE — ED Notes (Signed)
BEHAVIORAL HEALTH ROUNDING Patient sleeping: No. Patient alert and oriented: yes Behavior appropriate: Yes.   Nutrition and fluids offered: Yes  Toileting and hygiene offered: Yes  Sitter present: q15 min observations and security camera monitoring Law enforcement present: Yes [employer] 

## 2015-01-31 NOTE — [facility] Suicide Risk Assessment (Signed)
[facility]   Nursing information obtained from:    Demographic factors:    Current Mental Status:    Loss Factors:    Historical Factors:    Risk Reduction Factors:    Total Time spent with patient: 1 hour Principal Problem: Adjustment disorder with mixed anxiety and depressed mood Diagnosis:   Patient Active Problem List   Diagnosis Date Noted  . Adjustment disorder with mixed anxiety and depressed mood [F43.23] 01/31/2015  . Opiate overdose [T40.601A] 01/30/2015     Continued Clinical Symptoms:  Alcohol Use Disorder Identification Test Final Score (AUDIT): 0 The "Alcohol Use Disorders Identification Test", Guidelines for Use in Primary Care, Second Edition.  World Science writer[employer] [facility](WHO). Score between 0-7:  no or low risk or alcohol related problems. Score between 8-15:  moderate risk of alcohol related problems. Score between 16-19:  high risk of alcohol related problems. Score 20 or above:  warrants further diagnostic evaluation for alcohol dependence and treatment.   CLINICAL FACTORS:   Severe Anxiety and/or Agitation   Musculoskeletal: Strength & Muscle Tone: within normal limits Gait & Station: normal Patient leans: N/A  Psychiatric Specialty Exam: Physical Exam  Nursing note and vitals reviewed. Constitutional: She is oriented to person, place, and time. She appears well-developed and well-nourished.  HENT:  Head: Normocephalic and atraumatic.  Eyes: Conjunctivae and EOM are normal. Pupils are equal, round, and reactive to light.  Neck: Normal range of motion. Neck supple.  Cardiovascular: Normal rate, regular rhythm and normal heart sounds.   Respiratory: Effort normal and breath sounds normal.  GI: Soft. Bowel sounds are normal.  Musculoskeletal: Normal range of motion.  Neurological: She is alert and oriented to person, place, and time. She has normal reflexes.  Skin: Skin is warm and dry.    Review of Systems  All other  systems reviewed and are negative.   Blood pressure 107/66, pulse 97, temperature 98.6 F (37 C), temperature source Oral, resp. rate 18, height 5\' 5"  (1.651 m), weight 56.246 kg (124 lb), last menstrual period 01/01/2015.Body mass index is 20.63 kg/(m^2).  General Appearance: Casual  Eye Contact::  Good  Speech:  Clear and Coherent  Volume:  Normal  Mood:  Euthymic  Affect:  Appropriate  Thought Process:  Goal Directed  Orientation:  Full (Time, Place, and Person)  Thought Content:  WDL  Suicidal Thoughts:  No  Homicidal Thoughts:  No  Memory:  Immediate;   Good Recent;   Good Remote;   Good  Judgement:  Good  Insight:  Good  Psychomotor Activity:  Normal  Concentration:  Good  Recall:  Good  Fund of Knowledge:Good  Language: Good  Akathisia:  No  Handed:  Right  AIMS (if indicated):     Assets:  Communication Skills Desire for Improvement Financial Resources/Insurance Housing Physical Health Resilience Social Support Talents/Skills Transportation Vocational/Educational  Sleep:  Number of Hours: 6.15  Cognition: WNL  ADL's:  Intact     COGNITIVE FEATURES THAT CONTRIBUTE TO RISK:  None    SUICIDE RISK:   Minimal: No identifiable suicidal ideation.  Patients presenting with no risk factors but with morbid ruminations; may be classified as minimal risk based on the severity of the depressive symptoms  PLAN OF CARE:  [name] is a 39 year old female with no past psychiatric history admitted after overdose on pain medication in the context of marital conflict.  1. Suicidal ideation. At the patient denies any thoughts of hurting herself or others. She  denies any intention or plan. She is able to contract for safety.  2. Mood and anxiety. At the patient reports slight anxiety. She denies any symptoms of depression. There is no history of depression. She is mad at her husband who wants a divorce. She has 2 children at job she loves and supportive family. She is not  interested in taking psychotropic medication. She is open to psychotherapy.  3. Disposition. The patient will be discharged to home. She will follow up with her therapist within her network. She was given information about our local therapist.   Medical Decision Making:  Review of Psycho-Social Stressors (1), Review or order clinical lab tests (1), New Problem, with no additional work-up planned (3) and Review of Medication Regimen & Side Effects (2)  I certify that inpatient services furnished can reasonably be expected to improve the patient's condition.   Kristine Dixon, Kristine Dixon 01/31/2015, 12:24 PM

## 2015-04-29 ENCOUNTER — Other Ambulatory Visit: Payer: Self-pay | Admitting: Obstetrics and Gynecology

## 2015-04-29 DIAGNOSIS — Z1231 Encounter for screening mammogram for malignant neoplasm of breast: Secondary | ICD-10-CM

## 2015-07-28 ENCOUNTER — Ambulatory Visit
Admission: RE | Admit: 2015-07-28 | Discharge: 2015-07-28 | Disposition: A | Discharge status: Home / Self Care | Payer: BC Managed Care – PPO | Source: Ambulatory Visit | Attending: Obstetrics and Gynecology | Admitting: Obstetrics and Gynecology

## 2015-07-28 DIAGNOSIS — Z1231 Encounter for screening mammogram for malignant neoplasm of breast: Secondary | ICD-10-CM | POA: Diagnosis present

## 2016-05-06 ENCOUNTER — Other Ambulatory Visit: Payer: Self-pay | Admitting: Obstetrics and Gynecology

## 2016-05-06 DIAGNOSIS — Z1231 Encounter for screening mammogram for malignant neoplasm of breast: Secondary | ICD-10-CM

## 2016-07-30 ENCOUNTER — Ambulatory Visit
Admission: RE | Admit: 2016-07-30 | Discharge: 2016-07-30 | Disposition: A | Discharge status: Home / Self Care | Payer: BC Managed Care – PPO | Source: Ambulatory Visit | Attending: Obstetrics and Gynecology | Admitting: Obstetrics and Gynecology

## 2016-07-30 DIAGNOSIS — Z1231 Encounter for screening mammogram for malignant neoplasm of breast: Secondary | ICD-10-CM | POA: Diagnosis not present

## 2017-05-11 ENCOUNTER — Other Ambulatory Visit: Payer: Self-pay | Admitting: Obstetrics and Gynecology

## 2017-05-11 DIAGNOSIS — Z1239 Encounter for other screening for malignant neoplasm of breast: Secondary | ICD-10-CM

## 2017-08-01 ENCOUNTER — Other Ambulatory Visit: Payer: Self-pay | Admitting: Obstetrics and Gynecology

## 2017-08-01 ENCOUNTER — Ambulatory Visit
Admission: RE | Admit: 2017-08-01 | Discharge: 2017-08-01 | Disposition: A | Discharge status: Home / Self Care | Payer: BC Managed Care – PPO | Source: Ambulatory Visit | Attending: Obstetrics and Gynecology | Admitting: Obstetrics and Gynecology

## 2017-08-01 DIAGNOSIS — Z1239 Encounter for other screening for malignant neoplasm of breast: Secondary | ICD-10-CM

## 2017-08-01 DIAGNOSIS — Z1231 Encounter for screening mammogram for malignant neoplasm of breast: Secondary | ICD-10-CM | POA: Insufficient documentation

## 2018-05-16 ENCOUNTER — Other Ambulatory Visit: Payer: Self-pay | Admitting: Obstetrics and Gynecology

## 2018-05-16 DIAGNOSIS — Z1231 Encounter for screening mammogram for malignant neoplasm of breast: Secondary | ICD-10-CM

## 2020-05-27 ENCOUNTER — Other Ambulatory Visit: Payer: Self-pay | Admitting: Obstetrics and Gynecology

## 2020-05-27 DIAGNOSIS — Z1231 Encounter for screening mammogram for malignant neoplasm of breast: Secondary | ICD-10-CM

## 2020-06-18 ENCOUNTER — Other Ambulatory Visit: Payer: Self-pay

## 2020-06-18 ENCOUNTER — Ambulatory Visit
Admission: RE | Admit: 2020-06-18 | Discharge: 2020-06-18 | Disposition: A | Discharge status: Home / Self Care | Payer: BC Managed Care – PPO | Source: Ambulatory Visit | Attending: Obstetrics and Gynecology | Admitting: Obstetrics and Gynecology

## 2020-06-18 DIAGNOSIS — Z1231 Encounter for screening mammogram for malignant neoplasm of breast: Secondary | ICD-10-CM | POA: Insufficient documentation

## 2021-05-18 ENCOUNTER — Other Ambulatory Visit: Payer: Self-pay | Admitting: Obstetrics and Gynecology

## 2021-05-18 DIAGNOSIS — Z1231 Encounter for screening mammogram for malignant neoplasm of breast: Secondary | ICD-10-CM

## 2021-07-16 ENCOUNTER — Other Ambulatory Visit: Payer: Self-pay

## 2021-07-16 ENCOUNTER — Ambulatory Visit
Admission: RE | Admit: 2021-07-16 | Discharge: 2021-07-16 | Disposition: A | Discharge status: Home / Self Care | Payer: BC Managed Care – PPO | Source: Ambulatory Visit | Attending: Obstetrics and Gynecology | Admitting: Obstetrics and Gynecology

## 2021-07-16 DIAGNOSIS — Z1231 Encounter for screening mammogram for malignant neoplasm of breast: Secondary | ICD-10-CM | POA: Diagnosis not present

## 2022-06-15 ENCOUNTER — Other Ambulatory Visit: Payer: Self-pay | Admitting: Obstetrics and Gynecology

## 2022-06-15 DIAGNOSIS — Z1231 Encounter for screening mammogram for malignant neoplasm of breast: Secondary | ICD-10-CM

## 2022-07-19 ENCOUNTER — Ambulatory Visit
Admission: RE | Admit: 2022-07-19 | Discharge: 2022-07-19 | Disposition: A | Discharge status: Home / Self Care | Payer: BC Managed Care – PPO | Source: Ambulatory Visit | Attending: Obstetrics and Gynecology | Admitting: Obstetrics and Gynecology

## 2022-07-19 DIAGNOSIS — Z1231 Encounter for screening mammogram for malignant neoplasm of breast: Secondary | ICD-10-CM | POA: Insufficient documentation

## 2022-07-21 ENCOUNTER — Other Ambulatory Visit: Payer: Self-pay | Admitting: Obstetrics and Gynecology

## 2022-07-22 ENCOUNTER — Other Ambulatory Visit: Payer: Self-pay | Admitting: Obstetrics and Gynecology

## 2022-07-22 DIAGNOSIS — R928 Other abnormal and inconclusive findings on diagnostic imaging of breast: Secondary | ICD-10-CM

## 2022-07-22 DIAGNOSIS — N6489 Other specified disorders of breast: Secondary | ICD-10-CM

## 2022-08-03 ENCOUNTER — Ambulatory Visit
Admission: RE | Admit: 2022-08-03 | Discharge: 2022-08-03 | Disposition: A | Discharge status: Home / Self Care | Payer: BC Managed Care – PPO | Source: Ambulatory Visit | Attending: Obstetrics and Gynecology | Admitting: Obstetrics and Gynecology

## 2022-08-03 DIAGNOSIS — N6489 Other specified disorders of breast: Secondary | ICD-10-CM | POA: Diagnosis present

## 2022-08-03 DIAGNOSIS — R928 Other abnormal and inconclusive findings on diagnostic imaging of breast: Secondary | ICD-10-CM | POA: Insufficient documentation

## 2023-01-07 IMAGING — MG MM DIGITAL SCREENING BILAT W/ TOMO AND CAD
6 of 10 series · 6 of 30 positions shown · non-contrast
Comparison: Previous exam(s).

CLINICAL DATA: Screening.

EXAM:
DIGITAL SCREENING BILATERAL MAMMOGRAM WITH TOMOSYNTHESIS AND CAD
TECHNIQUE: Bilateral screening digital craniocaudal and mediolateral oblique
mammograms were obtained. Bilateral screening digital breast
tomosynthesis was performed. The images were evaluated with
computer-aided detection.

[L CC synth-2D]
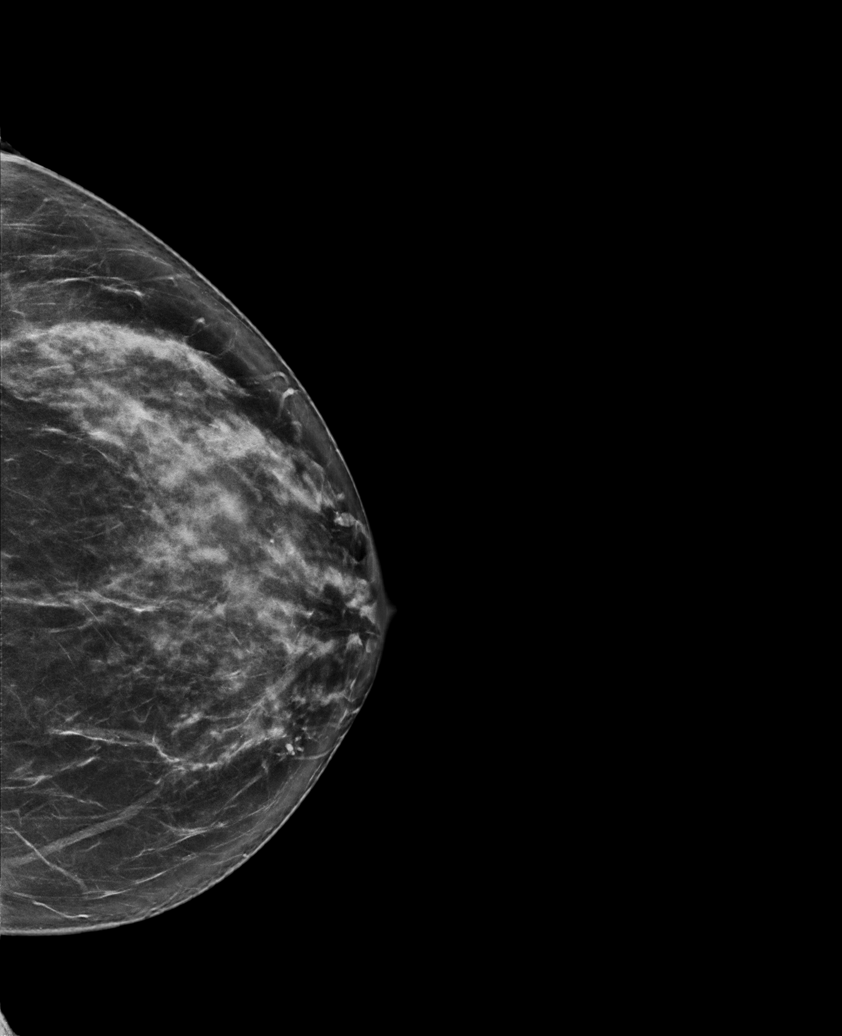

[R CC synth-2D (1 of 2)]
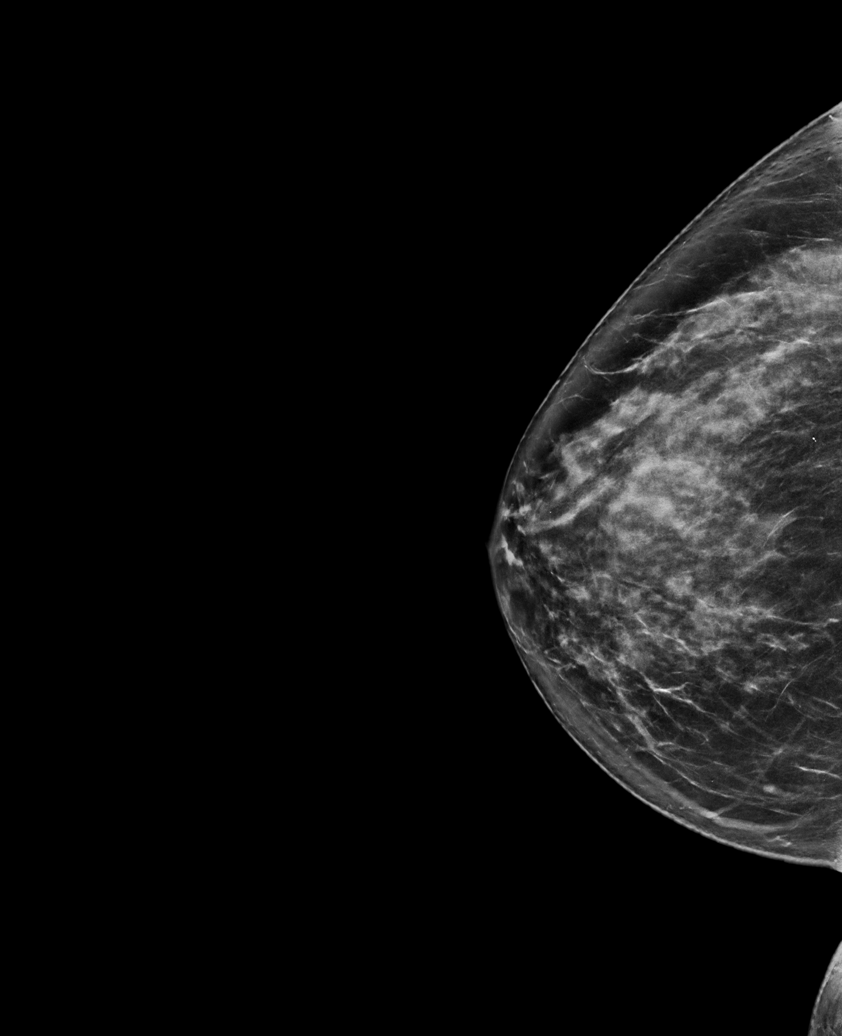

[R CC synth-2D (2 of 2)]
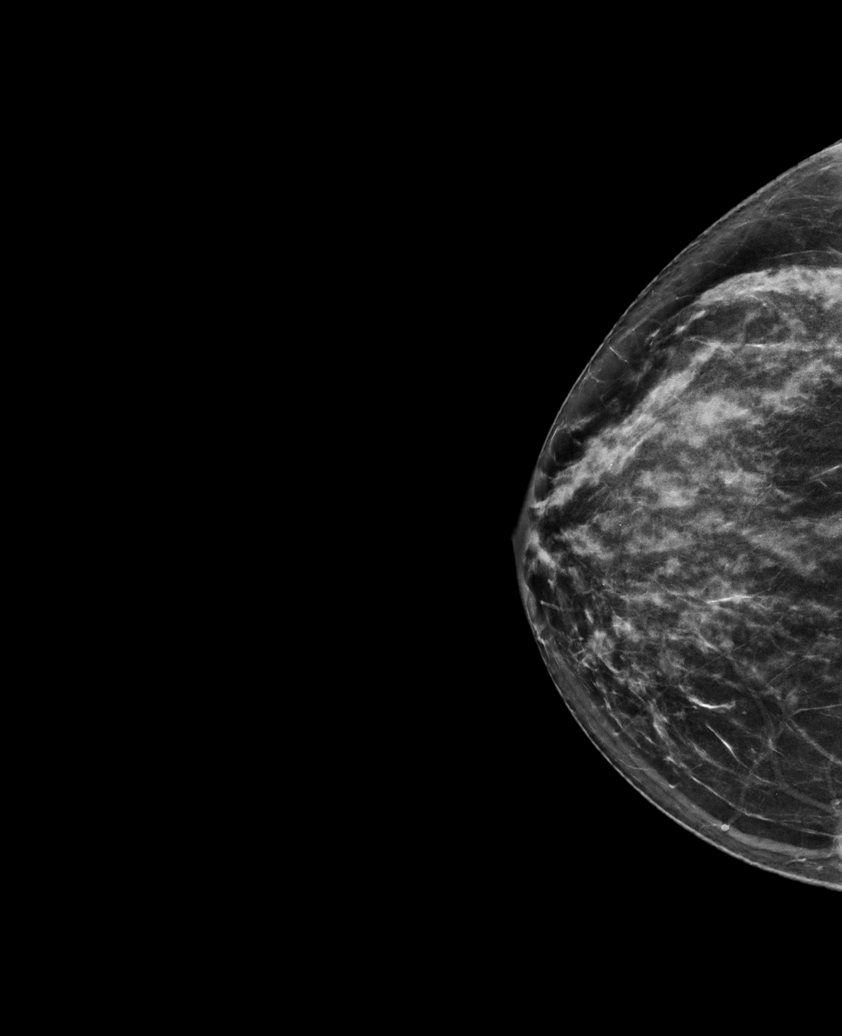

[L MLO synth-2D]
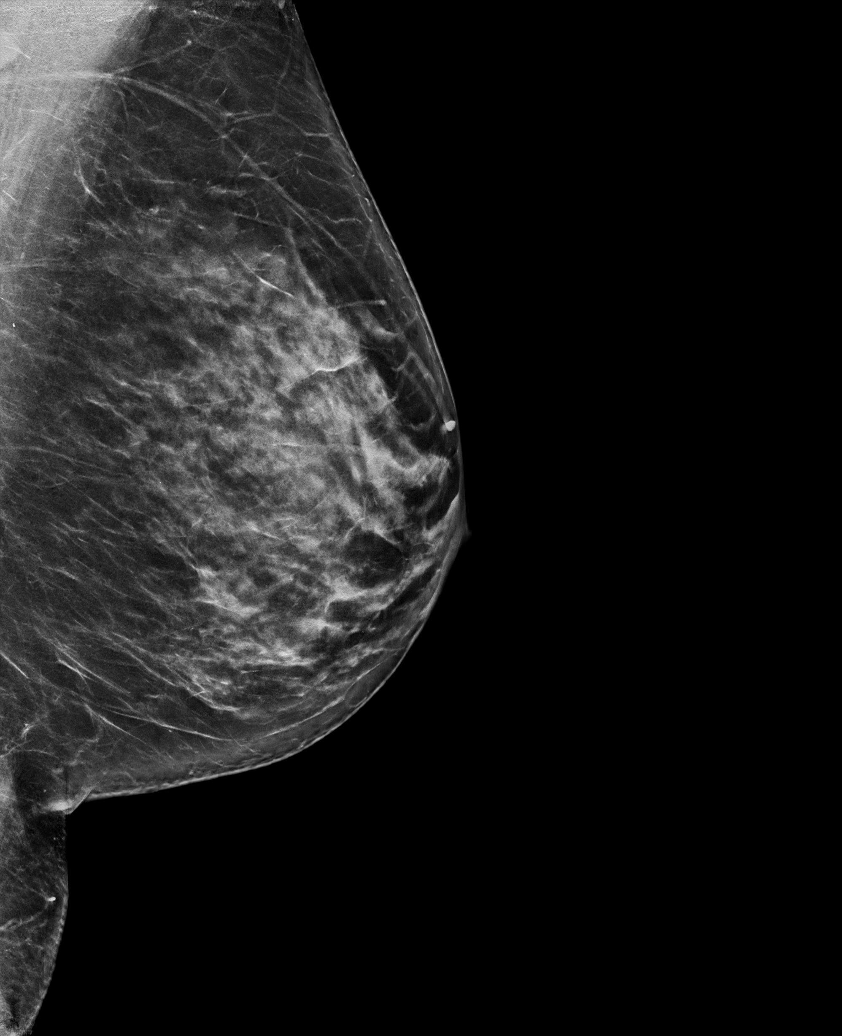

[R MLO synth-2D]
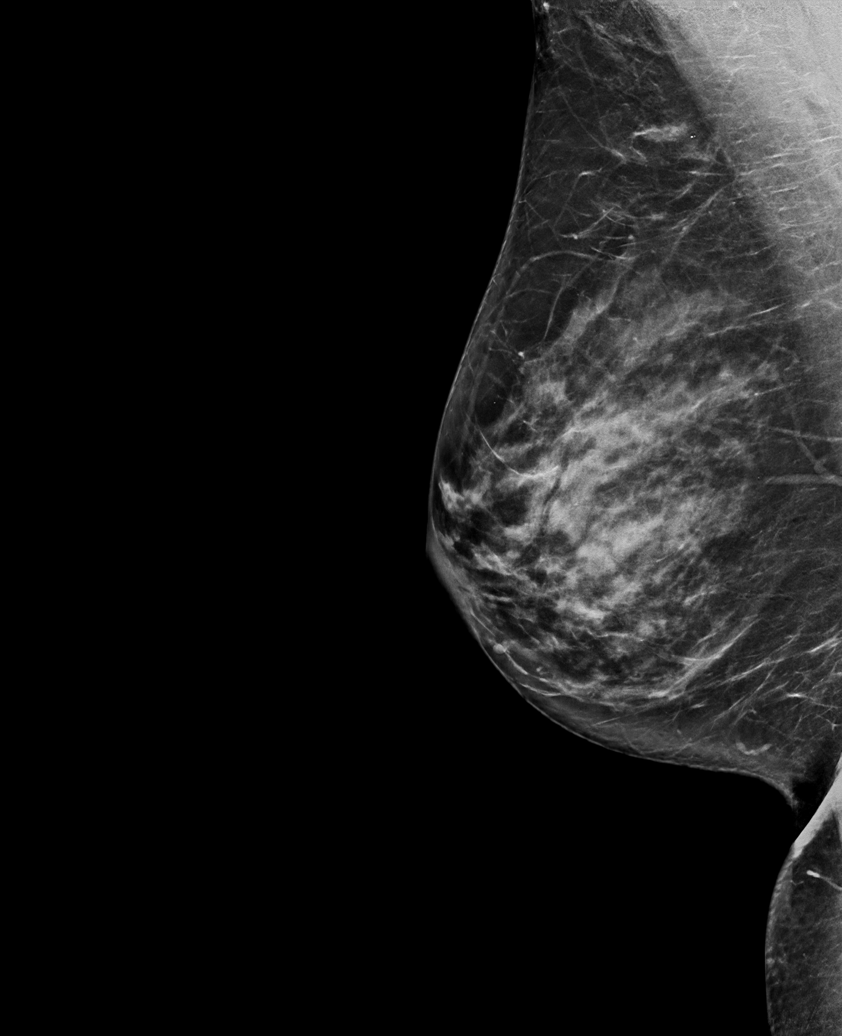

[R CC tomo · tomo slice 38/75.0]
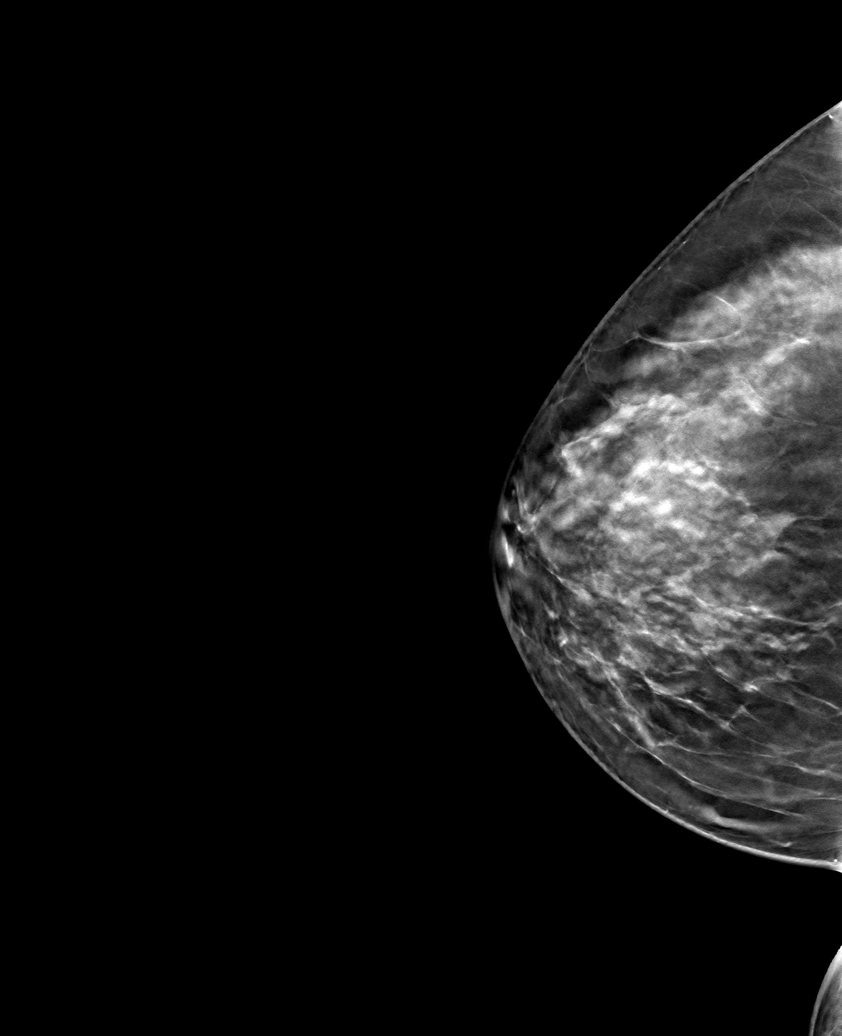

[6 of 30 positions shown; findings below may reference images not displayed]

ACR Breast Density Category c: The breast tissue is heterogeneously
dense, which may obscure small masses.
FINDINGS: There are no findings suspicious for malignancy.
IMPRESSION: No mammographic evidence of malignancy. A result letter of this
screening mammogram will be mailed directly to the patient.

RECOMMENDATION:
Screening mammogram in one year. (Code:[id number])

BI-RADS CATEGORY  1: Negative.

## 2023-06-20 ENCOUNTER — Other Ambulatory Visit: Payer: Self-pay | Admitting: Obstetrics and Gynecology

## 2023-06-20 DIAGNOSIS — Z1231 Encounter for screening mammogram for malignant neoplasm of breast: Secondary | ICD-10-CM

## 2023-07-25 ENCOUNTER — Ambulatory Visit
Admission: RE | Admit: 2023-07-25 | Discharge: 2023-07-25 | Disposition: A | Discharge status: Home / Self Care | Payer: BC Managed Care – PPO | Source: Ambulatory Visit | Attending: Obstetrics and Gynecology | Admitting: Obstetrics and Gynecology

## 2023-07-25 DIAGNOSIS — Z1231 Encounter for screening mammogram for malignant neoplasm of breast: Secondary | ICD-10-CM | POA: Insufficient documentation

## 2024-06-11 ENCOUNTER — Other Ambulatory Visit: Payer: Self-pay | Admitting: Obstetrics and Gynecology

## 2024-06-11 DIAGNOSIS — Z1231 Encounter for screening mammogram for malignant neoplasm of breast: Secondary | ICD-10-CM

## 2024-07-25 ENCOUNTER — Ambulatory Visit
Admission: RE | Admit: 2024-07-25 | Discharge: 2024-07-25 | Disposition: A | Discharge status: Home / Self Care | Payer: Self-pay | Source: Ambulatory Visit | Attending: Obstetrics and Gynecology | Admitting: Obstetrics and Gynecology

## 2024-07-25 DIAGNOSIS — Z1231 Encounter for screening mammogram for malignant neoplasm of breast: Secondary | ICD-10-CM | POA: Insufficient documentation
